# Patient Record
Sex: Male | Born: 1985 | Race: Black or African American | Hispanic: No | Marital: Single | Smoking: Never smoker
Health system: Southern US, Community
[De-identification: ages and names within clinical notes are randomized; demographics above are authoritative.]

---

## 2011-11-01 ENCOUNTER — Encounter (HOSPITAL_COMMUNITY): Payer: Self-pay | Admitting: Emergency Medicine

## 2011-11-01 ENCOUNTER — Emergency Department (HOSPITAL_COMMUNITY): Payer: Self-pay

## 2011-11-01 ENCOUNTER — Emergency Department (HOSPITAL_COMMUNITY)
Admission: EM | Admit: 2011-11-01 | Discharge: 2011-11-01 | Disposition: A | Discharge status: Home / Self Care | Payer: Self-pay | Attending: Emergency Medicine | Admitting: Emergency Medicine

## 2011-11-01 DIAGNOSIS — S93409A Sprain of unspecified ligament of unspecified ankle, initial encounter: Secondary | ICD-10-CM | POA: Insufficient documentation

## 2011-11-01 DIAGNOSIS — S93402A Sprain of unspecified ligament of left ankle, initial encounter: Secondary | ICD-10-CM

## 2011-11-01 DIAGNOSIS — Y9239 Other specified sports and athletic area as the place of occurrence of the external cause: Secondary | ICD-10-CM | POA: Insufficient documentation

## 2011-11-01 DIAGNOSIS — Y998 Other external cause status: Secondary | ICD-10-CM | POA: Insufficient documentation

## 2011-11-01 DIAGNOSIS — X500XXA Overexertion from strenuous movement or load, initial encounter: Secondary | ICD-10-CM | POA: Insufficient documentation

## 2011-11-01 DIAGNOSIS — Y9367 Activity, basketball: Secondary | ICD-10-CM | POA: Insufficient documentation

## 2011-11-01 MED ORDER — HYDROCODONE-ACETAMINOPHEN 5-325 MG PO TABS: 1.0000 | ORAL_TABLET | ORAL | Status: AC | PRN

## 2011-11-01 MED ORDER — HYDROCODONE-ACETAMINOPHEN 5-325 MG PO TABS
1.0000 | ORAL_TABLET | Freq: Once | ORAL | Status: AC
  Administered 2011-11-01: 1 via ORAL
  Filled 2011-11-01: qty 1

## 2011-11-01 NOTE — ED Notes (Signed)
Pt states he "rolled his ankle" on Thursday.  States swelling was going down, however pt went to work today, and ankle became more swollen.  2+ pulses, no edema. Able to walk on foot, with difficulty.

## 2011-11-01 NOTE — Progress Notes (Signed)
Orthopedic Tech Progress Note Patient Details:  Chris Hale 1985/05/30 [id number]  Ortho Devices Type of Ortho Device: ASO;Crutches Ortho Device/Splint Location: left ankle Ortho Device/Splint Interventions: Application   Chris Hale 11/01/2011, 10:07 PM

## 2011-11-01 NOTE — ED Notes (Addendum)
Rolled left ankle on Thursday and swelling not going down.

## 2011-11-01 NOTE — Discharge Instructions (Signed)
Ankle Sprain An ankle sprain is an injury to the strong, fibrous tissues (ligaments) that hold the bones of your ankle joint together.  CAUSES Ankle sprain usually is caused by a fall or by twisting your ankle. People who participate in sports are more prone to these types of injuries.  SYMPTOMS  Symptoms of ankle sprain include:  Pain in your ankle. The pain may be present at rest or only when you are trying to stand or walk.   Swelling.   Bruising. Bruising may develop immediately or within 1 to 2 days after your injury.   Difficulty standing or walking.  DIAGNOSIS  Your caregiver will ask you details about your injury and perform a physical exam of your ankle to determine if you have an ankle sprain. During the physical exam, your caregiver will press and squeeze specific areas of your foot and ankle. Your caregiver will try to move your ankle in certain ways. An X-ray exam may be done to be sure a bone was not broken or a ligament did not separate from one of the bones in your ankle (avulsion).  TREATMENT  Certain types of braces can help stabilize your ankle. Your caregiver can make a recommendation for this. Your caregiver may recommend the use of medication for pain. If your sprain is severe, your caregiver may refer you to a surgeon who helps to restore function to parts of your skeletal system (orthopedist) or a physical therapist. HOME CARE INSTRUCTIONS  Apply ice to your injury for 1 to 2 days or as directed by your caregiver. Applying ice helps to reduce inflammation and pain.  Put ice in a plastic bag.   Place a towel between your skin and the bag.   Leave the ice on for 15 to 20 minutes at a time, every 2 hours while you are awake.   Take over-the-counter or prescription medicines for pain, discomfort, or fever only as directed by your caregiver.   Keep your injured leg elevated, when possible, to lessen swelling.   If your caregiver recommends crutches, use them as  instructed. Gradually, put weight on the affected ankle. Continue to use crutches or a cane until you can walk without feeling pain in your ankle.   If you have a plaster splint, wear the splint as directed by your caregiver. Do not rest it on anything harder than a pillow the first 24 hours. Do not put weight on it. Do not get it wet. You may take it off to take a shower or bath.   You may have been given an elastic bandage to wear around your ankle to provide support. If the elastic bandage is too tight (you have numbness or tingling in your foot or your foot becomes cold and blue), adjust the bandage to make it comfortable.   If you have an air splint, you may blow more air into it or let air out to make it more comfortable. You may take your splint off at night and before taking a shower or bath.   Wiggle your toes in the splint several times per day if you are able.  SEEK MEDICAL CARE IF:   You have an increase in bruising, swelling, or pain.   Your toes feel cold.   Pain relief is not achieved with medication.  SEEK IMMEDIATE MEDICAL CARE IF: Your toes are numb or blue or you have severe pain. MAKE SURE YOU:   Understand these instructions.   Will watch your condition.     Will get help right away if you are not doing well or get worse.  Document Released: 05/03/2005 Document Revised: 04/22/2011 Document Reviewed: 12/06/2007 [facility] Patient Information 2012 [place], [place].Acute Ankle Sprain with Phase I Rehab An acute ankle sprain is a partial or complete tear in one or more of the ligaments of the ankle due to traumatic injury. The severity of the injury depends on both the the number of ligaments sprained and the grade of sprain. There are 3 grades of sprains.   A grade 1 sprain is a mild sprain. There is a slight pull without obvious tearing. There is no loss of strength, and the muscle and ligament are the correct length.   A grade 2 sprain is a moderate sprain. There is  tearing of fibers within the substance of the ligament where it connects two bones or two cartilages. The length of the ligament is increased, and there is usually decreased strength.   A grade 3 sprain is a complete rupture of the ligament and is uncommon.  In addition to the grade of sprain, there are three types of ankle sprains.  Lateral ankle sprains: This is a sprain of one or more of the three ligaments on the outer side (lateral) of the ankle. These are the most common sprains. Medial ankle sprains: There is one large triangular ligament of the inner side (medial) of the ankle that is susceptible to injury. Medial ankle sprains are less common. Syndesmosis, "high ankle," sprains: The syndesmosis is the ligament that connects the two bones of the lower leg. Syndesmosis sprains usually only occur with very severe ankle sprains. SYMPTOMS  Pain, tenderness, and swelling in the ankle, starting at the side of injury that may progress to the whole ankle and foot with time.   "Pop" or tearing sensation at the time of injury.   Bruising that may spread to the heel.   Impaired ability to walk soon after injury.  CAUSES   Acute ankle sprains are caused by trauma placed on the ankle that temporarily forces or pries the anklebone (talus) out of its normal socket.   Stretching or tearing of the ligaments that normally hold the joint in place (usually due to a twisting injury).  RISK INCREASES WITH:  Previous ankle sprain.   Sports in which the foot may land awkwardly (ie. basketball, volleyball, or soccer) or walking or running on uneven or rough surfaces.   Shoes with inadequate support to prevent sideways motion when stress occurs.   Poor strength and flexibility.   Poor balance skills.   Contact sports.  PREVENTION   Warm up and stretch properly before activity.   Maintain physical fitness:   Ankle and leg flexibility, muscle strength, and endurance.   Cardiovascular fitness.     Balance training activities.   Use proper technique and have a coach correct improper technique.   Taping, protective strapping, bracing, or high-top tennis shoes may help prevent injury. Initially, tape is best; however, it loses most of its support function within 10 to 15 minutes.   Wear proper fitted protective shoes (High-top shoes with taping or bracing is more effective than either alone).   Provide the ankle with support during sports and practice activities for 12 months following injury.  PROGNOSIS   If treated properly, ankle sprains can be expected to recover completely; however, the length of recovery depends on the degree of injury.   A grade 1 sprain usually heals enough in 5 to 7 days to allow  modified activity and requires an average of 6 weeks to heal completely.   A grade 2 sprain requires 6 to 10 weeks to heal completely.   A grade 3 sprain requires 12 to 16 weeks to heal.   A syndesmosis sprain often takes more than 3 months to heal.  RELATED COMPLICATIONS   Frequent recurrence of symptoms may result in a chronic problem. Appropriately addressing the problem the first time decreases the frequency of recurrence and optimizes healing time. Severity of the initial sprain does not predict the likelihood of later instability.   Injury to other structures (bone, cartilage, or tendon).   A chronically unstable or arthritic ankle joint is a possiblity with repeated sprains.  TREATMENT Treatment initially involves the use of ice, medication, and compression bandages to help reduce pain and inflammation. Ankle sprains are usually immobilized in a walking cast or boot to allow for healing. Crutches may be recommended to reduce pressure on the injury. After immobilization, strengthening and stretching exercises may be necessary to regain strength and a full range of motion. Surgery is rarely needed to treat ankle sprains. MEDICATION   Nonsteroidal anti-inflammatory  medications, such as aspirin and ibuprofen (do not take for the first 3 days after injury or within 7 days before surgery), or other minor pain relievers, such as acetaminophen, are often recommended. Take these as directed by your caregiver. Contact your caregiver immediately if any bleeding, stomach upset, or signs of an allergic reaction occur from these medications.   Ointments applied to the skin may be helpful.   Pain relievers may be prescribed as necessary by your caregiver. Do not take prescription pain medication for longer than 4 to 7 days. Use only as directed and only as much as you need.  HEAT AND COLD  Cold treatment (icing) is used to relieve pain and reduce inflammation for acute and chronic cases. Cold should be applied for 10 to 15 minutes every 2 to 3 hours for inflammation and pain and immediately after any activity that aggravates your symptoms. Use ice packs or an ice massage.   Heat treatment may be used before performing stretching and strengthening activities prescribed by your caregiver. Use a heat pack or a warm soak.  SEEK IMMEDIATE MEDICAL CARE IF:   Pain, swelling, or bruising worsens despite treatment.   You experience pain, numbness, discoloration, or coldness in the foot or toes.   New, unexplained symptoms develop (drugs used in treatment may produce side effects.)  EXERCISES  PHASE I EXERCISES RANGE OF MOTION (ROM) AND STRETCHING EXERCISES - Ankle Sprain, Acute Phase I, Weeks 1 to 2 These exercises may help you when beginning to restore flexibility in your ankle. You will likely work on these exercises for the 1 to 2 weeks after your injury. Once your physician, physical therapist, or athletic trainer sees adequate progress, he or she will advance your exercises. While completing these exercises, remember:   Restoring tissue flexibility helps normal motion to return to the joints. This allows healthier, less painful movement and activity.   An effective  stretch should be held for at least 30 seconds.   A stretch should never be painful. You should only feel a gentle lengthening or release in the stretched tissue.  RANGE OF MOTION - Dorsi/Plantar Flexion  While sitting with your right / left knee straight, draw the top of your foot upwards by flexing your ankle. Then reverse the motion, pointing your toes downward.   Hold each position for __________ seconds.  After completing your first set of exercises, repeat this exercise with your knee bent.  Repeat __________ times. Complete this exercise __________ times per day.  RANGE OF MOTION - Ankle Alphabet  Imagine your right / left big toe is a pen.   Keeping your hip and knee still, write out the entire alphabet with your "pen." Make the letters as large as you can without increasing any discomfort.  Repeat __________ times. Complete this exercise __________ times per day.  STRENGTHENING EXERCISES - Ankle Sprain, Acute -Phase I, Weeks 1 to 2 These exercises may help you when beginning to restore strength in your ankle. You will likely work on these exercises for 1 to 2 weeks after your injury. Once your physician, physical therapist, or athletic trainer sees adequate progress, he or she will advance your exercises. While completing these exercises, remember:   Muscles can gain both the endurance and the strength needed for everyday activities through controlled exercises.   Complete these exercises as instructed by your physician, physical therapist, or athletic trainer. Progress the resistance and repetitions only as guided.   You may experience muscle soreness or fatigue, but the pain or discomfort you are trying to eliminate should never worsen during these exercises. If this pain does worsen, stop and make certain you are following the directions exactly. If the pain is still present after adjustments, discontinue the exercise until you can discuss the trouble with your clinician.    STRENGTH - Dorsiflexors  Secure a rubber exercise band/tubing to a fixed object (ie. table, pole) and loop the other end around your right / left foot.   Sit on the floor facing the fixed object. The band/tubing should be slightly tense when your foot is relaxed.   Slowly draw your foot back toward you using your ankle and toes.   Hold this position for __________ seconds. Slowly release the tension in the band and return your foot to the starting position.  Repeat __________ times. Complete this exercise __________ times per day.  STRENGTH - Plantar-flexors   Sit with your right / left leg extended. Holding onto both ends of a rubber exercise band/tubing, loop it around the ball of your foot. Keep a slight tension in the band.   Slowly push your toes away from you, pointing them downward.   Hold this position for __________ seconds. Return slowly, controlling the tension in the band/tubing.  Repeat __________ times. Complete this exercise __________ times per day.  STRENGTH - Ankle Eversion  Secure one end of a rubber exercise band/tubing to a fixed object (table, pole). Loop the other end around your foot just before your toes.   Place your fists between your knees. This will focus your strengthening at your ankle.   Drawing the band/tubing across your opposite foot, slowly, pull your little toe out and up. Make sure the band/tubing is positioned to resist the entire motion.   Hold this position for __________ seconds.  Have your muscles resist the band/tubing as it slowly pulls your foot back to the starting position.  Repeat __________ times. Complete this exercise __________ times per day.  STRENGTH - Ankle Inversion  Secure one end of a rubber exercise band/tubing to a fixed object (table, pole). Loop the other end around your foot just before your toes.   Place your fists between your knees. This will focus your strengthening at your ankle.   Slowly, pull your big toe up  and in, making sure the band/tubing is positioned to  resist the entire motion.   Hold this position for __________ seconds.   Have your muscles resist the band/tubing as it slowly pulls your foot back to the starting position.  Repeat __________ times. Complete this exercises __________ times per day.  STRENGTH - Towel Curls  Sit in a chair positioned on a non-carpeted surface.   Place your right / left foot on a towel, keeping your heel on the floor.   Pull the towel toward your heel by only curling your toes. Keep your heel on the floor.   If instructed by your physician, physical therapist, or athletic trainer, add weight to the end of the towel.  Repeat __________ times. Complete this exercise __________ times per day. Document Released: 12/02/2004 Document Revised: 04/22/2011 Document Reviewed: 08/15/2008 [facility] Patient Information 2012 [place], [place].

## 2011-11-01 NOTE — ED Provider Notes (Signed)
History     CSN: [id number]  Arrival date & time 11/01/11  2012   First MD Initiated Contact with Patient 11/01/11 2127      No chief complaint on file.   (Consider location/radiation/quality/duration/timing/severity/associated sxs/prior treatment) HPI Comments: Patient states that he was playing basketball on Saturday when he twisted his left ankle - states has been able to ambulate on the ankle but that he went back to work today and the swelling has gotten worse - denies fever, chills, nausea, vomiting, numbness or tingling.  Patient is a 26 y.o. male presenting with ankle pain. The history is provided by the patient. No language interpreter was used.  Ankle Pain  The incident occurred more than 2 days ago. The incident occurred at the gym. The injury mechanism was torsion. The pain is present in the left ankle. The quality of the pain is described as aching. The pain is at a severity of 5/10. The pain is moderate. The pain has been constant since onset. Pertinent negatives include no numbness, no inability to bear weight, no loss of motion, no muscle weakness, no loss of sensation and no tingling. He reports no foreign bodies present. The symptoms are aggravated by bearing weight. He has tried NSAIDs for the symptoms. The treatment provided no relief.    No past medical history on file.  No past surgical history on file.  No family history on file.  History  Substance Use Topics  . Smoking status: Not on file  . Smokeless tobacco: Not on file  . Alcohol Use: Not on file      Review of Systems  Constitutional: Negative for fever and chills.  HENT: Negative for neck pain.   Eyes: Negative for pain.  Respiratory: Negative for chest tightness and shortness of breath.   Cardiovascular: Negative for chest pain.  Gastrointestinal: Negative for nausea, vomiting and abdominal pain.  Genitourinary: Negative for dysuria.  Musculoskeletal: Positive for joint swelling and  arthralgias. Negative for back pain and gait problem.  Skin: Negative for wound.  Neurological: Negative for tingling, numbness and headaches.  All other systems reviewed and are negative.    Allergies  Review of patient's allergies indicates no known allergies.  Home Medications   Current Outpatient Rx  Name Route Sig Dispense Refill  . IBUPROFEN 200 MG PO TABS Oral Take 800 mg by mouth every 6 (six) hours as needed. For pain      BP 130/75  Temp 97.8 F (36.6 C) (Oral)  Resp 18  SpO2 98%  Physical Exam  Nursing note and vitals reviewed. Constitutional: He is oriented to person, place, and time. He appears well-developed and well-nourished. No distress.  HENT:  Head: Normocephalic and atraumatic.  Right Ear: External ear normal.  Left Ear: External ear normal.  Nose: Nose normal.  Mouth/Throat: Oropharynx is clear and moist. No oropharyngeal exudate.  Eyes: Conjunctivae are normal. Pupils are equal, round, and reactive to light. No scleral icterus.  Neck: Normal range of motion. Neck supple.  Cardiovascular: Normal rate, regular rhythm and normal heart sounds.  Exam reveals no gallop and no friction rub.   No murmur heard. Pulmonary/Chest: Effort normal and breath sounds normal. No respiratory distress. He has no wheezes. He has no rales. He exhibits no tenderness.  Abdominal: Soft. Bowel sounds are normal. He exhibits no distension. There is no tenderness.  Musculoskeletal:       Left ankle: He exhibits decreased range of motion and swelling. He exhibits no deformity and  normal pulse. tenderness. Lateral malleolus tenderness found. No medial malleolus tenderness found.  Lymphadenopathy:    He has no cervical adenopathy.  Neurological: He is alert and oriented to person, place, and time. No cranial nerve deficit.  Skin: Skin is warm and dry. No rash noted. No erythema. No pallor.  Psychiatric: He has a normal mood and affect. His behavior is normal. Judgment and thought  content normal.    ED Course  Procedures (including critical care time)  Labs Reviewed - No data to display Dg Ankle Complete Left  11/01/2011  *RADIOLOGY REPORT*  Clinical Data: Fall, pain.  LEFT ANKLE COMPLETE - 3+ VIEW  Comparison: None.  Findings: Lateral soft tissue swelling.  No fracture, subluxation or dislocation.  Degenerative changes within the left ankle.  IMPRESSION: No acute bony abnormality.  Original Report Authenticated By: Cyndie Chime, M.D.     Left ankle sprain   MDM  Patient here with left ankle sprain without fracture - will be given ASO and crutches - instructed to stay off the ankle for the next week.        Izola Price [place], [place] 11/01/11 2158

## 2011-11-01 NOTE — ED Notes (Signed)
Rx x 1, pt voiced understanding to f/u with ortho in 1 week if sx do not improve

## 2011-11-02 NOTE — ED Provider Notes (Signed)
Medical screening examination/treatment/procedure(s) were performed by non-physician practitioner and as supervising physician I was immediately available for consultation/collaboration.  Jumar Greenstreet L Bena Kobel, MD 11/02/11 2020 

## 2013-03-13 ENCOUNTER — Ambulatory Visit: Payer: Self-pay

## 2013-03-16 ENCOUNTER — Emergency Department (HOSPITAL_COMMUNITY)
Admission: EM | Admit: 2013-03-16 | Discharge: 2013-03-16 | Disposition: A | Discharge status: Home / Self Care | Payer: Self-pay | Attending: Emergency Medicine | Admitting: Emergency Medicine

## 2013-03-16 ENCOUNTER — Encounter (HOSPITAL_COMMUNITY): Payer: Self-pay | Admitting: Emergency Medicine

## 2013-03-16 DIAGNOSIS — J029 Acute pharyngitis, unspecified: Secondary | ICD-10-CM | POA: Insufficient documentation

## 2013-03-16 LAB — RAPID STREP SCREEN (MED CTR MEBANE ONLY): Streptococcus, Group A Screen (Direct): NEGATIVE

## 2013-03-16 MED ORDER — ACETAMINOPHEN 325 MG PO TABS
650.0000 mg | ORAL_TABLET | Freq: Once | ORAL | Status: AC
  Administered 2013-03-16: 650 mg via ORAL
  Filled 2013-03-16: qty 2

## 2013-03-16 MED ORDER — ACETAMINOPHEN 325 MG PO TABS: 650.0000 mg | ORAL_TABLET | Freq: Four times a day (QID) | ORAL | Status: DC | PRN

## 2013-03-16 MED ORDER — IBUPROFEN 800 MG PO TABS: 800.0000 mg | ORAL_TABLET | Freq: Four times a day (QID) | ORAL | Status: DC | PRN

## 2013-03-16 MED ORDER — HYDROCODONE-ACETAMINOPHEN 7.5-325 MG/15ML PO SOLN
10.0000 mL | Freq: Once | ORAL | Status: AC
  Administered 2013-03-16: 10 mL via ORAL
  Filled 2013-03-16: qty 15

## 2013-03-16 MED ORDER — HYDROCODONE-ACETAMINOPHEN 7.5-325 MG/15ML PO SOLN: 10.0000 mL | Freq: Four times a day (QID) | ORAL | Status: DC | PRN

## 2013-03-16 NOTE — ED Provider Notes (Signed)
CSN: [id number]     Arrival date & time 03/16/13  0117 History   First MD Initiated Contact with Patient 03/16/13 0140     Chief Complaint  Patient presents with  . Sore Throat   (Consider location/radiation/quality/duration/timing/severity/associated sxs/prior Treatment) HPI Comments: Patient is a 27 yo M presenting for two days of a moderate sore throat w/ associated myalgias. Patient has been taking Tylenol cough and cold medicine with minimal to no relief from his symptoms. He states eating worsens his pain. Denies any other aggravating factors. No known sick contacts. Denies cough, nasal congestion, rhinorrhea, ear pain, headache, documented fever, vomiting.   Patient is a 27 y.o. male presenting with pharyngitis.  Sore Throat Associated symptoms include a sore throat. Pertinent negatives include no abdominal pain, congestion, coughing, fever or headaches.    History reviewed. No pertinent past medical history. History reviewed. No pertinent past surgical history. History reviewed. No pertinent family history. History  Substance Use Topics  . Smoking status: Never Smoker   . Smokeless tobacco: Not on file  . Alcohol Use: No    Review of Systems  Constitutional: Negative for fever.  HENT: Positive for sore throat. Negative for congestion, drooling, ear pain, facial swelling, rhinorrhea, sinus pressure, trouble swallowing and voice change.   Respiratory: Negative for cough.   Gastrointestinal: Negative for abdominal pain.  Neurological: Negative for headaches.    Allergies  Review of patient's allergies indicates no known allergies.  Home Medications   Current Outpatient Rx  Name  Route  Sig  Dispense  Refill  . acetaminophen (TYLENOL) 325 MG tablet   Oral   Take 2 tablets (650 mg total) by mouth every 6 (six) hours as needed for pain or fever.   30 tablet   0   . HYDROcodone-acetaminophen (HYCET) 7.5-325 mg/15 ml solution   Oral   Take 10 mLs by mouth every 6  (six) hours as needed for pain or cough.   120 mL   0   . ibuprofen (ADVIL,MOTRIN) 200 MG tablet   Oral   Take 800 mg by mouth every 6 (six) hours as needed. For pain         . ibuprofen (ADVIL,MOTRIN) 800 MG tablet   Oral   Take 1 tablet (800 mg total) by mouth every 6 (six) hours as needed for pain or fever.   21 tablet   0    BP 130/86  Pulse 78  Temp(Src) 100 F (37.8 C) (Oral)  Resp 20  SpO2 100% Physical Exam  Constitutional: He is oriented to person, place, and time. He appears well-developed and well-nourished. No distress.  HENT:  Head: Normocephalic and atraumatic.  Right Ear: External ear normal.  Left Ear: External ear normal.  Nose: Nose normal.  Mouth/Throat: Uvula is midline. Mucous membranes are dry. No oral lesions. No trismus in the jaw. No dental abscesses or uvula swelling. Oropharyngeal exudate and posterior oropharyngeal erythema present. No posterior oropharyngeal edema.  Eyes: Conjunctivae are normal.  Neck: Normal range of motion. Neck supple. No tracheal deviation present.  Cardiovascular: Normal rate.   Pulmonary/Chest: Effort normal.  Abdominal: Soft.  Musculoskeletal: Normal range of motion.  Lymphadenopathy:    He has cervical adenopathy.  Neurological: He is alert and oriented to person, place, and time.  Skin: Skin is warm and dry. He is not diaphoretic.  Psychiatric: He has a normal mood and affect.    ED Course  Procedures (including critical care time) Medications  acetaminophen (TYLENOL) tablet  650 mg (650 mg Oral Given 03/16/13 0226)  HYDROcodone-acetaminophen (HYCET) 7.5-325 mg/15 ml solution 10 mL (10 mLs Oral Given 03/16/13 0247)    Labs Review Labs Reviewed  RAPID STREP SCREEN  CULTURE, GROUP A STREP   Imaging Review No results found.  EKG Interpretation   None       MDM   1. Viral pharyngitis    Low grade fever, NAD, non-toxic appearing, AAOx4. Pt w/ low grade fever without tonsillar exudate, negative  strep. Presents with mild cervical lymphadenopathy, & dysphagia; diagnosis of viral pharyngitis. No abx indicated. DC w symptomatic tx for pain  Pt does not appear dehydrated, but did discuss importance of water rehydration. Presentation non concerning for PTA or infxn spread to soft tissue. No trismus or uvula deviation. Specific return precautions discussed. Pt able to drink water in ED without difficulty with intact air way. Recommended PCP follow up. Patient is agreeable to plan. Patient d/w with Dr. Norlene Campbell, agrees with plan. Patient is stable at time of discharge         Jeannetta Ellis, PA-C 03/16/13 7253

## 2013-03-16 NOTE — ED Notes (Signed)
Sore throat for two days. Body hurting all over.

## 2013-03-16 NOTE — ED Notes (Signed)
Pt has ride home.

## 2013-03-16 NOTE — ED Provider Notes (Signed)
Medical screening examination/treatment/procedure(s) were performed by non-physician practitioner and as supervising physician I was immediately available for consultation/collaboration.    Tanza Pellot M Yaqueline Gutter, MD 03/16/13 0702 

## 2013-03-18 LAB — CULTURE, GROUP A STREP

## 2013-03-20 ENCOUNTER — Emergency Department (HOSPITAL_COMMUNITY)
Admission: EM | Admit: 2013-03-20 | Discharge: 2013-03-20 | Disposition: A | Discharge status: Home / Self Care | Payer: Self-pay | Attending: Emergency Medicine | Admitting: Emergency Medicine

## 2013-03-20 ENCOUNTER — Encounter (HOSPITAL_COMMUNITY): Payer: Self-pay | Admitting: Emergency Medicine

## 2013-03-20 DIAGNOSIS — J029 Acute pharyngitis, unspecified: Secondary | ICD-10-CM | POA: Insufficient documentation

## 2013-03-20 DIAGNOSIS — R509 Fever, unspecified: Secondary | ICD-10-CM | POA: Insufficient documentation

## 2013-03-20 DIAGNOSIS — IMO0001 Reserved for inherently not codable concepts without codable children: Secondary | ICD-10-CM | POA: Insufficient documentation

## 2013-03-20 DIAGNOSIS — R1310 Dysphagia, unspecified: Secondary | ICD-10-CM | POA: Insufficient documentation

## 2013-03-20 DIAGNOSIS — R599 Enlarged lymph nodes, unspecified: Secondary | ICD-10-CM | POA: Insufficient documentation

## 2013-03-20 LAB — CBC WITH DIFFERENTIAL/PLATELET
Basophils Absolute: 0 10*3/uL (ref 0.0–0.1)
Basophils Relative: 0 % (ref 0–1)
Hemoglobin: 14.8 g/dL (ref 13.0–17.0)
MCHC: 35.2 g/dL (ref 30.0–36.0)
Neutro Abs: 8.7 10*3/uL — ABNORMAL HIGH (ref 1.7–7.7)
Neutrophils Relative %: 74 % (ref 43–77)
RDW: 13.7 % (ref 11.5–15.5)

## 2013-03-20 MED ORDER — SODIUM CHLORIDE 0.9 % IV BOLUS (SEPSIS)
1000.0000 mL | Freq: Once | INTRAVENOUS | Status: AC
  Administered 2013-03-20: 1000 mL via INTRAVENOUS

## 2013-03-20 MED ORDER — DEXAMETHASONE SODIUM PHOSPHATE 10 MG/ML IJ SOLN
10.0000 mg | Freq: Once | INTRAMUSCULAR | Status: AC
  Administered 2013-03-20: 10 mg via INTRAVENOUS
  Filled 2013-03-20: qty 1

## 2013-03-20 MED ORDER — KETOROLAC TROMETHAMINE 30 MG/ML IJ SOLN
30.0000 mg | Freq: Once | INTRAMUSCULAR | Status: AC
  Administered 2013-03-20: 30 mg via INTRAVENOUS
  Filled 2013-03-20: qty 1

## 2013-03-20 MED ORDER — ACETAMINOPHEN-CODEINE 120-12 MG/5ML PO SOLN: 10.0000 mL | ORAL | Status: DC | PRN

## 2013-03-20 NOTE — ED Provider Notes (Signed)
CSN: [id number]     Arrival date & time 03/20/13  4098 History   First MD Initiated Contact with Patient 03/20/13 0525     Chief Complaint  Patient presents with  . Sore Throat   (Consider location/radiation/quality/duration/timing/severity/associated sxs/prior Treatment) HPI Comments: Patient has had a sore throat, and myalgias.  For 7-10 days.  Was seen October 31 and, had a negative strep screen and culture.  Since that time.  He has had increased pain, low-grade fever, difficulty swallowing.  It is not controlled by oral narcotics or liquid ibuprofen.  Patient is a 27 y.o. male presenting with pharyngitis. The history is provided by the patient.  Sore Throat This is a recurrent problem. The current episode started in the past 7 days. The problem occurs constantly. The problem has been gradually worsening. Associated symptoms include a sore throat and swollen glands. Pertinent negatives include no coughing, fever, headaches, neck pain or rash. The symptoms are aggravated by swallowing. He has tried acetaminophen, drinking, NSAIDs and oral narcotics for the symptoms. The treatment provided no relief.    No past medical history on file. No past surgical history on file. No family history on file. History  Substance Use Topics  . Smoking status: Never Smoker   . Smokeless tobacco: Not on file  . Alcohol Use: No    Review of Systems  Constitutional: Negative for fever.  HENT: Positive for sore throat, trouble swallowing and voice change.   Respiratory: Negative for cough.   Musculoskeletal: Negative for neck pain and neck stiffness.  Skin: Negative for rash.  Neurological: Negative for dizziness and headaches.  All other systems reviewed and are negative.    Allergies  Review of patient's allergies indicates no known allergies.  Home Medications   Current Outpatient Rx  Name  Route  Sig  Dispense  Refill  . acetaminophen (TYLENOL) 325 MG tablet   Oral   Take 2 tablets (650  mg total) by mouth every 6 (six) hours as needed for pain or fever.   30 tablet   0   . HYDROcodone-acetaminophen (HYCET) 7.5-325 mg/15 ml solution   Oral   Take 10 mLs by mouth every 6 (six) hours as needed for pain or cough.   120 mL   0   . ibuprofen (ADVIL,MOTRIN) 200 MG tablet   Oral   Take 800 mg by mouth every 6 (six) hours as needed. For pain         . ibuprofen (ADVIL,MOTRIN) 800 MG tablet   Oral   Take 1 tablet (800 mg total) by mouth every 6 (six) hours as needed for pain or fever.   21 tablet   0    BP 139/94  Pulse 77  Temp(Src) 99.4 F (37.4 C) (Oral)  Resp 16  Wt 220 lb (99.791 kg)  SpO2 100% Physical Exam  Nursing note and vitals reviewed. Constitutional: He appears well-developed and well-nourished. No distress.  HENT:  Head: Normocephalic.  Right Ear: External ear normal.  Left Ear: External ear normal.  Mouth/Throat: Uvula is midline. Uvula swelling present. Posterior oropharyngeal edema and posterior oropharyngeal erythema present. No oropharyngeal exudate.  Eyes: Pupils are equal, round, and reactive to light.  Neck: Normal range of motion.  Cardiovascular: Normal rate and regular rhythm.   Pulmonary/Chest: Effort normal and breath sounds normal.  Abdominal: Soft. Bowel sounds are normal.  Lymphadenopathy:    He has cervical adenopathy.  Neurological: He is alert.  Skin: Skin is warm. No rash noted.  No pallor.    ED Course  Procedures (including critical care time) Labs Review Labs Reviewed  CBC WITH DIFFERENTIAL  MONONUCLEOSIS SCREEN   Imaging Review No results found.  EKG Interpretation   None       MDM  No diagnosis found.  Will obtain CBC, Monospot have asked that the patient had an IV started with 1 L of fluid.  10 mg of Decadron, and 30 mg of ibuprofen, IV.  For pain, and swelling    Arman Filter, NP 03/20/13 0537  Arman Filter, NP 03/20/13 [phone]

## 2013-03-20 NOTE — ED Notes (Signed)
Sore throat x one week.  Was seen here on 10/31 for same.  Reports medications given are not helping.

## 2013-03-20 NOTE — ED Notes (Signed)
Pt reports he is feeling much better.  Awaiting test results; anxious to eat.

## 2013-03-20 NOTE — ED Notes (Signed)
Presents with bilateral swelling to throat, tonsils enlarged.  Is experiencing difficulty swallowing, spitting into basin in room.  Able to speak, voice slightly muffled as he is unable to use tongue to speak due to pain.  Alert and appropriate, skin warm and dry.

## 2013-03-20 NOTE — ED Provider Notes (Signed)
Medical screening examination/treatment/procedure(s) were performed by non-physician practitioner and as supervising physician I was immediately available for consultation/collaboration.    Fateh Kindle M Chaysen Tillman, MD 03/20/13 0621 

## 2013-03-21 ENCOUNTER — Emergency Department (HOSPITAL_COMMUNITY): Payer: Self-pay

## 2013-03-21 ENCOUNTER — Encounter (HOSPITAL_COMMUNITY): Payer: Self-pay | Admitting: Emergency Medicine

## 2013-03-21 ENCOUNTER — Emergency Department (HOSPITAL_COMMUNITY)
Admission: EM | Admit: 2013-03-21 | Discharge: 2013-03-21 | Disposition: A | Discharge status: Home / Self Care | Payer: Self-pay | Attending: Emergency Medicine | Admitting: Emergency Medicine

## 2013-03-21 DIAGNOSIS — J36 Peritonsillar abscess: Secondary | ICD-10-CM | POA: Insufficient documentation

## 2013-03-21 DIAGNOSIS — R599 Enlarged lymph nodes, unspecified: Secondary | ICD-10-CM | POA: Insufficient documentation

## 2013-03-21 LAB — CBC WITH DIFFERENTIAL/PLATELET
Eosinophils Relative: 1 % (ref 0–5)
Lymphocytes Relative: 19 % (ref 12–46)
Monocytes Absolute: 1.3 10*3/uL — ABNORMAL HIGH (ref 0.1–1.0)
Monocytes Relative: 9 % (ref 3–12)
Neutrophils Relative %: 71 % (ref 43–77)
Platelets: 170 10*3/uL (ref 150–400)
RBC: 4.54 MIL/uL (ref 4.22–5.81)
WBC: 13.9 10*3/uL — ABNORMAL HIGH (ref 4.0–10.5)

## 2013-03-21 LAB — BASIC METABOLIC PANEL
CO2: 26 mEq/L (ref 19–32)
Calcium: 8.9 mg/dL (ref 8.4–10.5)
Chloride: 104 mEq/L (ref 96–112)
Sodium: 139 mEq/L (ref 135–145)

## 2013-03-21 MED ORDER — IOHEXOL 300 MG/ML  SOLN
80.0000 mL | Freq: Once | INTRAMUSCULAR | Status: AC | PRN
  Administered 2013-03-21: 80 mL via INTRAVENOUS

## 2013-03-21 MED ORDER — SODIUM CHLORIDE 0.9 % IV BOLUS (SEPSIS)
1000.0000 mL | Freq: Once | INTRAVENOUS | Status: AC
  Administered 2013-03-21: 1000 mL via INTRAVENOUS

## 2013-03-21 MED ORDER — HYDROMORPHONE HCL PF 1 MG/ML IJ SOLN
1.0000 mg | Freq: Once | INTRAMUSCULAR | Status: AC
  Administered 2013-03-21: 1 mg via INTRAVENOUS
  Filled 2013-03-21: qty 1

## 2013-03-21 MED ORDER — SODIUM CHLORIDE 0.9 % IV SOLN
INTRAVENOUS | Status: DC
  Administered 2013-03-21: 10:00:00 via INTRAVENOUS

## 2013-03-21 MED ORDER — CLINDAMYCIN PHOSPHATE 600 MG/50ML IV SOLN
600.0000 mg | Freq: Once | INTRAVENOUS | Status: AC
  Administered 2013-03-21: 600 mg via INTRAVENOUS
  Filled 2013-03-21: qty 50

## 2013-03-21 MED ORDER — ONDANSETRON HCL 4 MG/2ML IJ SOLN
4.0000 mg | Freq: Once | INTRAMUSCULAR | Status: AC
  Administered 2013-03-21: 4 mg via INTRAVENOUS
  Filled 2013-03-21: qty 2

## 2013-03-21 MED ORDER — CLINDAMYCIN PHOSPHATE 900 MG/50ML IV SOLN: 900.0000 mg | Freq: Once | INTRAVENOUS | Status: DC

## 2013-03-21 NOTE — ED Notes (Addendum)
Pt states that left side of face and head hurts. Pt states that he has increased difficulty with swallowing. Pt noted to have red swollen tonsil on left side. Pt reports ear pain on left side as well.
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
## 2013-03-21 NOTE — ED Notes (Signed)
Pt states that he has had a sore throat since last week. Pt was seen yesterday for same. States that pain has continued. Pt did fill prescription for tylenol with codeine but states that it is not working. Pt has not called for followup appt

## 2013-03-21 NOTE — ED Provider Notes (Signed)
Medical screening examination/treatment/procedure(s) were performed by non-physician practitioner and as supervising physician I was immediately available for consultation/collaboration.    Olivia Mackie, MD 03/21/13 [phone]

## 2013-03-21 NOTE — ED Notes (Signed)
Pt in CT.

## 2013-03-21 NOTE — ED Provider Notes (Signed)
  Physical Exam  BP 125/84  Pulse 69  Temp(Src) 97.8 F (36.6 C) (Oral)  Resp 16  SpO2 98%  Physical Exam  ED Course  Procedures  Patient is referred to ENT.  Patient's mono was negative.  The patient is maintaining his airway without difficulty    Carlyle Dolly, PA-C 03/21/13 [phone]

## 2013-03-21 NOTE — ED Notes (Signed)
Pt monitored per IV narcotic use. Pt drowsy but easily arousable- no adverse SE. Family member at bedside to pick up pt and take to ENT. Pt wheeled to discharge.
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
## 2014-02-20 ENCOUNTER — Emergency Department (HOSPITAL_COMMUNITY)
Admission: EM | Admit: 2014-02-20 | Discharge: 2014-02-20 | Disposition: A | Discharge status: Home / Self Care | Payer: Self-pay | Attending: Emergency Medicine | Admitting: Emergency Medicine

## 2014-02-20 ENCOUNTER — Encounter (HOSPITAL_COMMUNITY): Payer: Self-pay | Admitting: Emergency Medicine

## 2014-02-20 DIAGNOSIS — R1013 Epigastric pain: Secondary | ICD-10-CM | POA: Insufficient documentation

## 2014-02-20 DIAGNOSIS — R197 Diarrhea, unspecified: Secondary | ICD-10-CM | POA: Insufficient documentation

## 2014-02-20 DIAGNOSIS — J019 Acute sinusitis, unspecified: Secondary | ICD-10-CM | POA: Insufficient documentation

## 2014-02-20 DIAGNOSIS — I369 Nonrheumatic tricuspid valve disorder, unspecified: Secondary | ICD-10-CM

## 2014-02-20 DIAGNOSIS — R9431 Abnormal electrocardiogram [ECG] [EKG]: Secondary | ICD-10-CM | POA: Insufficient documentation

## 2014-02-20 LAB — CBC WITH DIFFERENTIAL/PLATELET
BASOS ABS: 0 10*3/uL (ref 0.0–0.1)
BASOS PCT: 0 % (ref 0–1)
EOS ABS: 0.1 10*3/uL (ref 0.0–0.7)
EOS PCT: 1 % (ref 0–5)
HCT: 42.9 % (ref 39.0–52.0)
Hemoglobin: 14.6 g/dL (ref 13.0–17.0)
Lymphocytes Relative: 26 % (ref 12–46)
Lymphs Abs: 1.8 10*3/uL (ref 0.7–4.0)
MCH: 28.4 pg (ref 26.0–34.0)
MCHC: 34 g/dL (ref 30.0–36.0)
MCV: 83.5 fL (ref 78.0–100.0)
MONO ABS: 0.6 10*3/uL (ref 0.1–1.0)
Monocytes Relative: 8 % (ref 3–12)
NEUTROS ABS: 4.5 10*3/uL (ref 1.7–7.7)
Neutrophils Relative %: 65 % (ref 43–77)
Platelets: 144 10*3/uL — ABNORMAL LOW (ref 150–400)
RBC: 5.14 MIL/uL (ref 4.22–5.81)
RDW: 13.4 % (ref 11.5–15.5)
WBC: 6.9 10*3/uL (ref 4.0–10.5)

## 2014-02-20 LAB — COMPREHENSIVE METABOLIC PANEL
ALBUMIN: 4 g/dL (ref 3.5–5.2)
ALT: 27 U/L (ref 0–53)
ANION GAP: 11 (ref 5–15)
AST: 23 U/L (ref 0–37)
Alkaline Phosphatase: 71 U/L (ref 39–117)
BILIRUBIN TOTAL: 0.4 mg/dL (ref 0.3–1.2)
BUN: 20 mg/dL (ref 6–23)
CALCIUM: 8.9 mg/dL (ref 8.4–10.5)
CHLORIDE: 104 meq/L (ref 96–112)
CO2: 25 mEq/L (ref 19–32)
CREATININE: 1.03 mg/dL (ref 0.50–1.35)
GFR calc Af Amer: >90 mL/min (ref >90)
GFR calc non Af Amer: >90 mL/min (ref >90)
Glucose, Bld: 102 mg/dL — ABNORMAL HIGH (ref 70–99)
Potassium: 4 mEq/L (ref 3.7–5.3)
Sodium: 140 mEq/L (ref 137–147)
Total Protein: 7.6 g/dL (ref 6.0–8.3)

## 2014-02-20 LAB — I-STAT TROPONIN, ED: Troponin i, poc: 0 ng/mL (ref 0.00–0.08)

## 2014-02-20 MED ORDER — HYDROCODONE-ACETAMINOPHEN 5-325 MG PO TABS
1.0000 | ORAL_TABLET | Freq: Once | ORAL | Status: AC
  Administered 2014-02-20: 1 via ORAL
  Filled 2014-02-20: qty 1

## 2014-02-20 MED ORDER — PSEUDOEPHEDRINE HCL 30 MG PO TABS: 30.0000 mg | ORAL_TABLET | Freq: Four times a day (QID) | ORAL | Status: DC | PRN

## 2014-02-20 MED ORDER — OXYMETAZOLINE HCL 0.05 % NA SOLN: 1.0000 | Freq: Two times a day (BID) | NASAL | Status: DC

## 2014-02-20 NOTE — ED Provider Notes (Signed)
CSN: [id number]     Arrival date & time 02/20/14  4098 History   First MD Initiated Contact with Patient 02/20/14 0740     Chief Complaint  Patient presents with  . Abdominal Pain  . URI     (Consider location/radiation/quality/duration/timing/severity/associated sxs/prior Treatment) HPI Comments: Patient presents with complaint of sinus congestion and pressure with sore throat x3 days. He had subjective fever but no documented fever. Family members at home had also been sick with similar symptoms. No nausea, vomiting. Patient has had several episodes of diarrhea. No chest pain or shortness of breath. Patient has taken over-the-counter vitamin C, TheraFlu, and Sudafed for symptoms.  Patient also has had intermittent episodes of epigastric pain during this time. Patient had a more significant episode early this morning which was relieved with a bowel movement. Patient now has very mild epigastric pain. Pain is described as sharp and nonradiating.  No prior cardiac history. No history of hypertension, high cholesterol, diabetes, smoking. No family history of early CAD. Patient denies alcohol or drug use including cocaine.     The history is provided by the patient and medical records.    History reviewed. No pertinent past medical history. History reviewed. No pertinent past surgical history. History reviewed. No pertinent family history. History  Substance Use Topics  . Smoking status: Never Smoker   . Smokeless tobacco: Not on file  . Alcohol Use: No    Review of Systems  Constitutional: Positive for fever (subjective).  HENT: Positive for rhinorrhea, sinus pressure and sore throat.   Eyes: Negative for redness.  Respiratory: Negative for cough and shortness of breath.   Cardiovascular: Negative for chest pain.  Gastrointestinal: Positive for abdominal pain (epigastric) and diarrhea. Negative for nausea and vomiting.  Genitourinary: Negative for dysuria.  Musculoskeletal:  Negative for myalgias.  Skin: Negative for rash.  Neurological: Negative for headaches.    Allergies  Review of patient's allergies indicates no known allergies.  Home Medications   Prior to Admission medications   Medication Sig Start Date End Date Taking? Authorizing Provider  Ascorbic Acid (VITAMIN C PO) Take 1 tablet by mouth every 4 (four) hours as needed (immune health).    Yes Historical Provider, MD  Chlorphen-Pseudoephed-APAP (THERAFLU FLU/COLD PO) Take 1 packet by mouth 2 (two) times daily as needed (pain/cold symptoms).   Yes Historical Provider, MD  Phenyleph-CPM-DM-APAP (TYLENOL COLD HEAD CONGESTION PO) Take 2 tablets by mouth every 4 (four) hours as needed (pain/cold symptoms).   Yes Historical Provider, MD   BP 122/75  Pulse 66  Temp(Src) 97.6 F (36.4 C)  Resp 18  SpO2 99%  Physical Exam  Nursing note and vitals reviewed. Constitutional: He appears well-developed and well-nourished.  HENT:  Head: Normocephalic and atraumatic.  Right Ear: Tympanic membrane, external ear and ear canal normal.  Left Ear: Tympanic membrane, external ear and ear canal normal.  Nose: Mucosal edema and rhinorrhea present.  Mouth/Throat: Uvula is midline, oropharynx is clear and moist and mucous membranes are normal. Mucous membranes are not dry. No oropharyngeal exudate, posterior oropharyngeal edema or posterior oropharyngeal erythema.  Eyes: Conjunctivae are normal. Right eye exhibits no discharge. Left eye exhibits no discharge.  Neck: Trachea normal and normal range of motion. Neck supple. Normal carotid pulses and no JVD present. No muscular tenderness present. Carotid bruit is not present. No tracheal deviation present.  Cardiovascular: Normal rate, regular rhythm, S1 normal, S2 normal, normal heart sounds and intact distal pulses.  Exam reveals no distant heart  sounds and no decreased pulses.   No murmur heard. No murmur.   Pulmonary/Chest: Effort normal and breath sounds normal.  No respiratory distress. He has no wheezes. He has no rales. He exhibits no tenderness.  Abdominal: Soft. Normal aorta and bowel sounds are normal. There is no tenderness. There is no rebound and no guarding.  Musculoskeletal: He exhibits no edema.  Neurological: He is alert.  Skin: Skin is warm and dry. He is not diaphoretic. No cyanosis. No pallor.  Psychiatric: He has a normal mood and affect.    ED Course  Procedures (including critical care time) Labs Review Labs Reviewed  CBC WITH DIFFERENTIAL - Abnormal; Notable for the following:    Platelets 144 (*)    All other components within normal limits  COMPREHENSIVE METABOLIC PANEL - Abnormal; Notable for the following:    Glucose, Bld 102 (*)    All other components within normal limits  I-STAT TROPOININ, ED    Imaging Review No results found.   EKG Interpretation None      Patient seen and examined. Patient's exam is unremarkable. EKG ordered given epigastric pain and I noted inverted t-wave in II on monitor.    Vital signs reviewed and are as follows: BP 122/75  Pulse 66  Temp(Src) 97.6 F (36.4 C)  Resp 18  SpO2 99%  EKG with ischemic findings -- patient with poor story. Will discuss EKG with cardiology.   9:02 AM Dr. Gwendolyn GrantWalden spoke with Dr. Myrtis SerKatz. Plan: 2D echo and cardiac enzymes. If neg, can be discharged.   12:01 PM 2D echo complete. Awaiting results.   1:45 PM Small area on which ECHO cannot rule-out hypokinesis. Otherwise unremarkable ECHO. D/w Dr. Gwendolyn GrantWalden. Patient is anxious to leave the ED now with or without discharge instructions. Need needs to pick up family members. Rx Afrin, sudafed. Cardiology f/u given and patient encouraged to follow-up.   Patient was counseled to return with severe chest pain, especially if the pain is crushing or pressure-like and spreads to the arms, back, neck, or jaw, or if they have sweating, nausea, or shortness of breath with the pain. They were encouraged to call 911 with these  symptoms.   They were also told to return if their chest pain gets worse and does not go away with rest, they have an attack of chest pain lasting longer than usual despite rest and treatment with the medications their caregiver has prescribed, if they wake from sleep with chest pain or shortness of breath, if they feel dizzy or faint, if they have chest pain not typical of their usual pain, or if they have any other emergent concerns regarding their health.  The patient verbalized understanding and agreed.   MDM   Final diagnoses:  Acute sinusitis, recurrence not specified, unspecified location  Abnormal EKG   Acute sinusitis: symptomatic treatment  Abnormal EKG: ECHO and troponin are both unremarkable. Pt to f/u with cardiology. Do not suspect ACS. Do not suspect peri/myocarditis.     Chris CriglerJoshua Vegas Fritze, PA-C 02/20/14 1350

## 2014-02-20 NOTE — ED Provider Notes (Signed)
Date: 02/20/2014  Rate: 61  Rhythm: normal sinus rhythm  QRS Axis: normal  Intervals: normal  ST/T Wave abnormalities: ST elevations laterally  Conduction Disutrbances:none  Narrative Interpretation:   Old EKG Reviewed: none available  42M here with epigastric pain, sharp and URI symptoms. His kids have been sick with similar symptoms. AFVSS here. EKG concerning for lateral STEMI. 28yo, no prior medical history, does not have active chest pain. I spoke personally with Dr. Myrtis SerKatz with Cards who recommended Echo and enzymes, if normal, if safe for discharge.    Elwin MochaBlair Julian Medina, MD 02/22/14 [phone]

## 2014-02-20 NOTE — ED Notes (Signed)
EKG given to Dr. Gwendolyn GrantWalden by Mayme GentaHayley, RN .

## 2014-02-20 NOTE — Progress Notes (Signed)
  Echocardiogram 2D Echocardiogram has been performed.  Chris Hale [name] 02/20/2014, 11:20 AM

## 2014-02-20 NOTE — ED Notes (Signed)
Pt taken to Echo via WC

## 2014-02-20 NOTE — ED Notes (Signed)
Per pt sts abdominal pain x 3 days with vomiting 2 days ago. sts that also he has been congested.

## 2014-02-20 NOTE — ED Notes (Signed)
EDP at bedside  

## 2014-02-20 NOTE — Discharge Instructions (Signed)
Please read and follow all provided instructions.  Your diagnoses today include:  1. Acute sinusitis, recurrence not specified, unspecified location   2. Abnormal EKG     Tests performed today include:  An EKG of your heart - abnormal as we discussed  Cardiac enzymes - a blood test for heart muscle damage, no sign of heart attack  Blood counts and electrolytes  Echocardiogram of heart - no definite problems  Vital signs. See below for your results today.   Medications prescribed:   Oxymetazoline - nasal spray for congestion. Do not use for more than 3 days because this medicine can cause rebound congestion.   Take any prescribed medications only as directed.  Follow-up instructions: Please follow-up with your primary care provider as soon as you can for further evaluation of your symptoms.   Return instructions:  SEEK IMMEDIATE MEDICAL ATTENTION IF:  You have severe chest pain, especially if the pain is crushing or pressure-like and spreads to the arms, back, neck, or jaw, or if you have sweating, nausea (feeling sick to your stomach), or shortness of breath. THIS IS AN EMERGENCY. Don't wait to see if the pain will go away. Get medical help at once. Call 911 or 0 (operator). DO NOT drive yourself to the hospital.   Your chest pain gets worse and does not go away with rest.   You have an attack of chest pain lasting longer than usual, despite rest and treatment with the medications your caregiver has prescribed.   You wake from sleep with chest pain or shortness of breath.  You feel dizzy or faint.  You have chest pain not typical of your usual pain for which you originally saw your caregiver.   You have any other emergent concerns regarding your health.  Additional Information: Chest pain comes from many different causes. Your caregiver has diagnosed you as having chest pain that is not specific for one problem, but does not require admission.  You are at low risk for an  acute heart condition or other serious illness.   Your vital signs today were: BP 123/73   Pulse 66   Temp(Src) 97.6 F (36.4 C)   Resp 12   SpO2 99% If your blood pressure (BP) was elevated above 135/85 this visit, please have this repeated by your doctor within one month. --------------

## 2014-02-21 NOTE — ED Provider Notes (Signed)
Medical screening examination/treatment/procedure(s) were conducted as a shared visit with non-physician practitioner(s) and myself.  I personally evaluated the patient during the encounter.   EKG Interpretation None      Patient here with abdominal pain, URI symptoms. EKG showed possible lateral STEMI. I spoke with Dr. Myrtis SerKatz about patient, he recommended Echo, labs. Patient without signs of symptoms of ACS. Labs ok. ECHO with good EF, possible area of hypokinesis on inferior wall. Patient is anxious to leave and will not stay. Given Cards f/u. Doubt STEMI, normal labs, H&P not c/w STEMI.  Elwin MochaBlair Vashaun Osmon, MD 02/21/14 [phone]
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
## 2014-10-04 DIAGNOSIS — Y9389 Activity, other specified: Secondary | ICD-10-CM | POA: Diagnosis not present

## 2014-10-04 DIAGNOSIS — S39012A Strain of muscle, fascia and tendon of lower back, initial encounter: Secondary | ICD-10-CM | POA: Diagnosis not present

## 2014-10-04 DIAGNOSIS — Y92410 Unspecified street and highway as the place of occurrence of the external cause: Secondary | ICD-10-CM | POA: Diagnosis not present

## 2014-10-04 DIAGNOSIS — S3992XA Unspecified injury of lower back, initial encounter: Secondary | ICD-10-CM | POA: Diagnosis present

## 2014-10-04 DIAGNOSIS — Z79899 Other long term (current) drug therapy: Secondary | ICD-10-CM | POA: Diagnosis not present

## 2014-10-04 DIAGNOSIS — Y998 Other external cause status: Secondary | ICD-10-CM | POA: Insufficient documentation

## 2014-10-05 ENCOUNTER — Encounter (HOSPITAL_COMMUNITY): Payer: Self-pay | Admitting: Emergency Medicine

## 2014-10-05 ENCOUNTER — Emergency Department (HOSPITAL_COMMUNITY)
Admission: EM | Admit: 2014-10-05 | Discharge: 2014-10-05 | Disposition: A | Discharge status: Home / Self Care | Payer: No Typology Code available for payment source | Attending: Emergency Medicine | Admitting: Emergency Medicine

## 2014-10-05 DIAGNOSIS — S39012A Strain of muscle, fascia and tendon of lower back, initial encounter: Secondary | ICD-10-CM

## 2014-10-05 MED ORDER — TRAMADOL HCL 50 MG PO TABS: 50.0000 mg | ORAL_TABLET | Freq: Four times a day (QID) | ORAL | Status: DC | PRN

## 2014-10-05 MED ORDER — NAPROXEN 375 MG PO TABS: 375.0000 mg | ORAL_TABLET | Freq: Two times a day (BID) | ORAL | Status: DC

## 2014-10-05 MED ORDER — CYCLOBENZAPRINE HCL 5 MG PO TABS: 5.0000 mg | ORAL_TABLET | Freq: Three times a day (TID) | ORAL | Status: DC | PRN
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
## 2015-10-29 ENCOUNTER — Emergency Department (HOSPITAL_COMMUNITY)
Admission: EM | Admit: 2015-10-29 | Discharge: 2015-10-29 | Disposition: A | Discharge status: Home / Self Care | Payer: No Typology Code available for payment source | Attending: Emergency Medicine | Admitting: Emergency Medicine

## 2015-10-29 ENCOUNTER — Encounter (HOSPITAL_COMMUNITY): Payer: Self-pay

## 2015-10-29 DIAGNOSIS — Z79899 Other long term (current) drug therapy: Secondary | ICD-10-CM | POA: Insufficient documentation

## 2015-10-29 DIAGNOSIS — S61217A Laceration without foreign body of left little finger without damage to nail, initial encounter: Secondary | ICD-10-CM | POA: Insufficient documentation

## 2015-10-29 DIAGNOSIS — Y999 Unspecified external cause status: Secondary | ICD-10-CM | POA: Insufficient documentation

## 2015-10-29 DIAGNOSIS — W268XXA Contact with other sharp object(s), not elsewhere classified, initial encounter: Secondary | ICD-10-CM | POA: Insufficient documentation

## 2015-10-29 DIAGNOSIS — Y9389 Activity, other specified: Secondary | ICD-10-CM | POA: Insufficient documentation

## 2015-10-29 DIAGNOSIS — Y929 Unspecified place or not applicable: Secondary | ICD-10-CM | POA: Insufficient documentation

## 2015-10-29 DIAGNOSIS — S61219A Laceration without foreign body of unspecified finger without damage to nail, initial encounter: Secondary | ICD-10-CM

## 2015-10-29 MED ORDER — IBUPROFEN 400 MG PO TABS
600.0000 mg | ORAL_TABLET | Freq: Once | ORAL | Status: AC
  Administered 2015-10-29: 600 mg via ORAL
  Filled 2015-10-29: qty 1

## 2015-10-29 MED ORDER — NAPROXEN 375 MG PO TABS: 375.0000 mg | ORAL_TABLET | Freq: Two times a day (BID) | ORAL | Status: DC

## 2015-10-29 MED ORDER — TETANUS-DIPHTH-ACELL PERTUSSIS 5-2.5-18.5 LF-MCG/0.5 IM SUSP
0.5000 mL | Freq: Once | INTRAMUSCULAR | Status: AC
  Administered 2015-10-29: 0.5 mL via INTRAMUSCULAR
  Filled 2015-10-29: qty 0.5

## 2015-10-29 NOTE — ED Notes (Signed)
PA at bedside.

## 2015-10-29 NOTE — Discharge Instructions (Signed)
Laceration Care, Adult  A laceration is a cut that goes through all layers of the skin. The cut also goes into the tissue that is right under the skin. Some cuts heal on their own. Others need to be closed with stitches (sutures), staples, skin adhesive strips, or wound glue. Taking care of your cut lowers your risk of infection and helps your cut to heal better.  HOW TO TAKE CARE OF YOUR CUT  For stitches or staples:  · Keep the wound clean and dry.  · If you were given a bandage (dressing), you should change it at least one time per day or as told by your doctor. You should also change it if it gets wet or dirty.  · Keep the wound completely dry for the first 24 hours or as told by your doctor. After that time, you may take a shower or a bath. However, make sure that the wound is not soaked in water until after the stitches or staples have been removed.  · Clean the wound one time each day or as told by your doctor:    Wash the wound with soap and water.    Rinse the wound with water until all of the soap comes off.    Pat the wound dry with a clean towel. Do not rub the wound.  · After you clean the wound, put a thin layer of antibiotic ointment on it as told by your doctor. This ointment:    Helps to prevent infection.    Keeps the bandage from sticking to the wound.  · Have your stitches or staples removed as told by your doctor.  If your doctor used skin adhesive strips:   · Keep the wound clean and dry.  · If you were given a bandage, you should change it at least one time per day or as told by your doctor. You should also change it if it gets dirty or wet.  · Do not get the skin adhesive strips wet. You can take a shower or a bath, but be careful to keep the wound dry.  · If the wound gets wet, pat it dry with a clean towel. Do not rub the wound.  · Skin adhesive strips fall off on their own. You can trim the strips as the wound heals. Do not remove any strips that are still stuck to the wound. They will  fall off after a while.  If your doctor used wound glue:  · Try to keep your wound dry, but you may briefly wet it in the shower or bath. Do not soak the wound in water, such as by swimming.  · After you take a shower or a bath, gently pat the wound dry with a clean towel. Do not rub the wound.  · Do not do any activities that will make you really sweaty until the skin glue has fallen off on its own.  · Do not apply liquid, cream, or ointment medicine to your wound while the skin glue is still on.  · If you were given a bandage, you should change it at least one time per day or as told by your doctor. You should also change it if it gets dirty or wet.  · If a bandage is placed over the wound, do not let the tape for the bandage touch the skin glue.  · Do not pick at the glue. The skin glue usually stays on for 5-10 days. Then, it   or when wound glue stays in place and the wound is healed. Make sure to wear a sunscreen of at least 30 SPF.  Take over-the-counter and prescription medicines only as told by your doctor.  If you were given antibiotic medicine or ointment, take or apply it as told by your doctor. Do not stop using the antibiotic even if your wound is getting better.  Do not scratch or pick at the wound.  Keep all follow-up visits as told by your doctor. This is important.  Check your wound every day for signs of infection. Watch for:  Redness, swelling, or pain.  Fluid, blood, or pus.  Raise (elevate) the injured area above the level of your heart while you are sitting or lying down, if possible. GET HELP IF:  You got a tetanus shot and you have any of these problems at the injection site:  Swelling.  Very bad pain.  Redness.  Bleeding.  You have a fever.  A wound that was  closed breaks open.  You notice a bad smell coming from your wound or your bandage.  You notice something coming out of the wound, such as wood or glass.  Medicine does not help your pain.  You have more redness, swelling, or pain at the site of your wound.  You have fluid, blood, or pus coming from your wound.  You notice a change in the color of your skin near your wound.  You need to change the bandage often because fluid, blood, or pus is coming from the wound.  You start to have a new rash.  You start to have numbness around the wound. GET HELP RIGHT AWAY IF:  You have very bad swelling around the wound.  Your pain suddenly gets worse and is very bad.  You notice painful lumps near the wound or on skin that is anywhere on your body.  You have a red streak going away from your wound.  The wound is on your hand or foot and you cannot move a finger or toe like you usually can.  The wound is on your hand or foot and you notice that your fingers or toes look pale or bluish.   This information is not intended to replace advice given to you by your health care provider. Make sure you discuss any questions you have with your health care provider.  Avoid bending your finger. Keep wound clean and dry. The laceration should be healed within 1 week. The Dermabond will come off on its own. At that time he can stop wearing the bandage. [facility]up with [facility] for reevaluation if her symptoms do not improve. Return to the ED if you experience severe worsening of your symptoms, redness or swelling around your wound, fevers, chills.

## 2015-10-29 NOTE — ED Notes (Signed)
Per pt he was fixing a light and light fixture came down on hand causing a lac to left pinky; Pt states he was wearing gloves; Pt a&o x 4 on arrival

## 2015-10-29 NOTE — ED Provider Notes (Signed)
CSN: [id number]     Arrival date & time 10/29/15  [id number] History  By signing my name below, I, Soijett Blue, attest that this documentation has been prepared under the direction and in the presence of Gaylyn RongSamantha Lennell Shanks, PA-C Electronically Signed: Soijett Blue, ED Scribe. 10/29/2015. 8:09 PM.   Chief Complaint  Patient presents with  . Laceration      The history is provided by the patient. No language interpreter was used.    Chris PalDarren Deiss is a 30 y.o. male who presents to the Emergency Department complaining of left pinky laceration onset 11 AM this morning. Pt reports that he was fixing a light with another person when the light fixture came down onto his left pinky, causing a laceration to the area. Pt denies injuries anywhere else and he reports that he was able to complete work. Pt is unsure of the status of his tetanus immunization. He states that he has not tried any medications for the relief for his symptoms. He denies color change, joint swelling, and any other symptoms.   No past medical history on file. No past surgical history on file. No family history on file. Social History  Substance Use Topics  . Smoking status: Never Smoker   . Smokeless tobacco: Not on file  . Alcohol Use: No    Review of Systems  Musculoskeletal: Positive for arthralgias (left 5th finger). Negative for joint swelling.  Skin: Positive for wound (lacertion to left pinky). Negative for color change.  All other systems reviewed and are negative.     Allergies  Review of patient's allergies indicates no known allergies.  Home Medications   Prior to Admission medications   Medication Sig Start Date End Date Taking? Authorizing Provider  Ascorbic Acid (VITAMIN C PO) Take 1 tablet by mouth every 4 (four) hours as needed (immune health).     Historical Provider, MD  Chlorphen-Pseudoephed-APAP (THERAFLU FLU/COLD PO) Take 1 packet by mouth 2 (two) times daily as needed (pain/cold symptoms).     Historical Provider, MD  cyclobenzaprine (FLEXERIL) 5 MG tablet Take 1 tablet (5 mg total) by mouth 3 (three) times daily as needed for muscle spasms. 10/05/14   Arthor CaptainAbigail Harris, PA-C  naproxen (NAPROSYN) 375 MG tablet Take 1 tablet (375 mg total) by mouth 2 (two) times daily. 10/05/14   Arthor CaptainAbigail Harris, PA-C  oxymetazoline (AFRIN NASAL SPRAY) 0.05 % nasal spray Place 1 spray into both nostrils 2 (two) times daily. 02/20/14   Renne CriglerJoshua Geiple, PA-C  Phenyleph-CPM-DM-APAP (TYLENOL COLD HEAD CONGESTION PO) Take 2 tablets by mouth every 4 (four) hours as needed (pain/cold symptoms).    Historical Provider, MD  pseudoephedrine (SUDAFED) 30 MG tablet Take 1 tablet (30 mg total) by mouth every 6 (six) hours as needed for congestion. 02/20/14   Renne CriglerJoshua Geiple, PA-C  traMADol (ULTRAM) 50 MG tablet Take 1 tablet (50 mg total) by mouth every 6 (six) hours as needed. 10/05/14   Abigail Harris, PA-C   BP 137/82 mmHg  Pulse 74  Temp(Src) 97.6 F (36.4 C) (Oral)  Resp 20  SpO2 100% Physical Exam  Constitutional: He is oriented to person, place, and time. He appears well-developed and well-nourished. No distress.  HENT:  Head: Normocephalic and atraumatic.  Eyes: Conjunctivae are normal. Right eye exhibits no discharge. Left eye exhibits no discharge. No scleral icterus.  Cardiovascular: Normal rate.   Pulmonary/Chest: Effort normal.  Musculoskeletal:  0.5 cm horizontal laceration to left fifth digit across PIP joint. No evidence of tendon injury.  No FB seen or palpated. No active bleeding.  Neurological: He is alert and oriented to person, place, and time. Coordination normal.  Skin: Skin is warm and dry. No rash noted. He is not diaphoretic. No erythema. No pallor.  Psychiatric: He has a normal mood and affect. His behavior is normal.  Nursing note and vitals reviewed.   ED Course  Procedures (including critical care time)  LACERATION REPAIR Performed by: Dub Mikes Authorized by: Dub Mikes Consent: Verbal consent obtained. Risks and benefits: risks, benefits and alternatives were discussed Consent given by: patient Patient identity confirmed: provided demographic data Prepped and Draped in normal sterile fashion Wound explored  Laceration Location: left 5th digit  Laceration Length: 0.5cm  No Foreign Bodies seen or palpated  Anesthesia: local infiltration  Local anesthetic: none  Irrigation method: syringe Amount of cleaning: standard  Skin closure: approximated  Technique: dermabond  Patient tolerance: Patient tolerated the procedure well with no immediate complications.  DIAGNOSTIC STUDIES: Oxygen Saturation is 100% on RA, nl by my interpretation.    COORDINATION OF CARE: 8:06 PM Discussed treatment plan with pt at bedside which includes wound care, laceration repair with dermabond, and update tetanus and pt agreed to plan.    Labs Review Labs Reviewed - No data to display  Imaging Review No results found.    EKG Interpretation None      MDM   Final diagnoses:  Finger laceration, initial encounter    Tetanus updated in ED. Laceration occurred < 12 hours prior to repair. Discussed laceration care with pt and answered questions. Wound repaired with dermabond. Pt advised to follow up if there are signs of dehiscence or infection. Pt is hemodynamically stable with no complaints prior to dc.    I personally performed the services described in this documentation, which was scribed in my presence. The recorded information has been reviewed and is accurate.     Lester Kinsman Bartley, PA-C 10/29/15 2312  Benjiman Core, MD 10/30/15 [phone]

## 2015-12-10 ENCOUNTER — Encounter (HOSPITAL_COMMUNITY): Payer: Self-pay

## 2015-12-10 ENCOUNTER — Emergency Department (HOSPITAL_COMMUNITY)
Admission: EM | Admit: 2015-12-10 | Discharge: 2015-12-10 | Disposition: A | Discharge status: Home / Self Care | Payer: No Typology Code available for payment source | Attending: Emergency Medicine | Admitting: Emergency Medicine

## 2015-12-10 DIAGNOSIS — X58XXXA Exposure to other specified factors, initial encounter: Secondary | ICD-10-CM | POA: Insufficient documentation

## 2015-12-10 DIAGNOSIS — Y929 Unspecified place or not applicable: Secondary | ICD-10-CM | POA: Insufficient documentation

## 2015-12-10 DIAGNOSIS — Y999 Unspecified external cause status: Secondary | ICD-10-CM | POA: Insufficient documentation

## 2015-12-10 DIAGNOSIS — Y939 Activity, unspecified: Secondary | ICD-10-CM | POA: Insufficient documentation

## 2015-12-10 DIAGNOSIS — S76012A Strain of muscle, fascia and tendon of left hip, initial encounter: Secondary | ICD-10-CM | POA: Insufficient documentation

## 2015-12-10 NOTE — ED Triage Notes (Signed)
Patient here with left hip pain since twisting hip on Friday while stepping off ladder pain with any movement, ambulation. No distress,

## 2015-12-10 NOTE — Discharge Instructions (Signed)
Please use ibuprofen or Tylenol as needed for discomfort. Please follow-up with orthopedic specialist if he continued to experience pain after 2 weeks.

## 2015-12-10 NOTE — ED Provider Notes (Signed)
[facility]-EMERGENCY DEPT Provider Note   CSN: [id number] date & time: 12/10/15  8413  First Provider Contact:  None    By signing my name below, I, Soijett Blue, attest that this documentation has been prepared under the direction and in the presence of Burna Forts, PA-C Electronically Signed: Soijett Blue, ED Scribe. 12/10/15. 9:38 AM.   History   Chief Complaint Chief Complaint  Patient presents with  . Hip Pain   HPI:  Chris Hale is a 30 y.o. male who presents to the Emergency Department complaining of left hip pain onset 5-6 days. Pt reports that he twisted his left hip while stepping incorrectly off a ladder and initially had pain to his left groin area. Pt states that his left hip pain is a 3/10 at rest and 7-8/10 with movement. Pt reports that his left hip pain is worsened with movement and lifting his left leg. Denies alleviating factors. Pt is having associated symptoms of left leg thigh, lower back pin, and gait problem due to left hip pain. Pt contributes his lower back pain, to compensating for his left hip pain. He notes that he has tried OTC pain medications and 3 aleve with his last dose of aleve being this morning with no relief of his symptoms. He denies color change, wound, rash, numbness, tingling, LE swelling, CP, SOB, n/v/d/c, abdominal pain, difficulty urinating, dysuria, and any other symptoms. Denies allergies to medications, past surgeries, or daily medication use. 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  The history is provided by the patient. No language interpreter was used.    History reviewed. No pertinent past medical history.  There are no active problems to display for this patient.   History reviewed. No pertinent surgical history.    Home Medications    Prior to Admission medications   Medication Sig Start Date End Date Taking? Authorizing Provider  Ascorbic Acid (VITAMIN C PO) Take 1 tablet by mouth every 4 (four) hours as needed (immune  health).     Historical Provider, MD  Chlorphen-Pseudoephed-APAP (THERAFLU FLU/COLD PO) Take 1 packet by mouth 2 (two) times daily as needed (pain/cold symptoms).    Historical Provider, MD  cyclobenzaprine (FLEXERIL) 5 MG tablet Take 1 tablet (5 mg total) by mouth 3 (three) times daily as needed for muscle spasms. 10/05/14   Arthor Captain, PA-C  naproxen (NAPROSYN) 375 MG tablet Take 1 tablet (375 mg total) by mouth 2 (two) times daily. 10/29/15   Samantha Tripp Dowless, PA-C  oxymetazoline (AFRIN NASAL SPRAY) 0.05 % nasal spray Place 1 spray into both nostrils 2 (two) times daily. 02/20/14   Renne Crigler, PA-C  Phenyleph-CPM-DM-APAP (TYLENOL COLD HEAD CONGESTION PO) Take 2 tablets by mouth every 4 (four) hours as needed (pain/cold symptoms).    Historical Provider, MD  pseudoephedrine (SUDAFED) 30 MG tablet Take 1 tablet (30 mg total) by mouth every 6 (six) hours as needed for congestion. 02/20/14   Renne Crigler, PA-C  traMADol (ULTRAM) 50 MG tablet Take 1 tablet (50 mg total) by mouth every 6 (six) hours as needed. 10/05/14   Arthor Captain, PA-C    Family History No family history on file.  Social History Social History  Substance Use Topics  . Smoking status: Never Smoker  . Smokeless tobacco: Never Used  . Alcohol use No     Allergies   Review of patient's allergies indicates no known allergies.   Review of Systems Review of Systems  Cardiovascular: Negative for leg swelling.  Gastrointestinal:  Negative for constipation, diarrhea, nausea and vomiting.  Genitourinary: Negative for difficulty urinating, dysuria and hematuria.  Musculoskeletal: Positive for arthralgias (left hip) and gait problem (due to pain). Negative for joint swelling.  Skin: Negative for color change, rash and wound.  Neurological: Negative for numbness.       No tingling     Physical Exam Updated Vital Signs BP 130/82 (BP Location: Right Arm)   Pulse 63   Temp 98 F (36.7 C) (Oral)   Resp 20   Ht  6\' 1"  (1.854 m)   Wt 108.9 kg   SpO2 100%   BMI 31.66 kg/m   Physical Exam  Constitutional: He is oriented to person, place, and time. He appears well-developed and well-nourished. No distress.  HENT:  Head: Normocephalic and atraumatic.  Eyes: EOM are normal.  Neck: Neck supple.  Cardiovascular: Normal rate, regular rhythm and normal heart sounds.  Exam reveals no gallop and no friction rub.   No murmur heard. Pulmonary/Chest: Effort normal and breath sounds normal. No respiratory distress. He has no wheezes. He has no rales.  Abdominal: He exhibits no distension.  Musculoskeletal: Normal range of motion.       Left hip: He exhibits tenderness. He exhibits normal strength and no swelling.  TTP of left anterior hip flexor worse with active flexion of hip. No pain with passive ROM. No swelling or edema. Distal strength 5/5.  Neurological: He is alert and oriented to person, place, and time.  Skin: Skin is warm and dry.  Psychiatric: He has a normal mood and affect. His behavior is normal.  Nursing note and vitals reviewed.    ED Treatments / Results  DIAGNOSTIC STUDIES: Oxygen Saturation is 99% on RA, nl by my interpretation.    COORDINATION OF CARE: 9:18 AM Discussed treatment plan with pt at bedside which includes 800 mg ibuprofen TID, hip strength gaining exercise, work note, and pt agreed to plan.  Procedures Procedures (including critical care time)  Medications Ordered in ED Medications - No data to display   Initial Impression / Assessment and Plan / ED Course  I have reviewed the triage vital signs and the nursing notes.   Clinical Course     Final Clinical Impressions(s) / ED Diagnoses   Final diagnoses:  Strain of hip flexor, left, initial encounter   Labs:   Imaging:   Consults:   Therapeutics:   Discharge Meds:   Assessment/Plan: Patient presents with left hip pain s/p mechanical injury, likely left hip flexor strain. No infectious etiology.  Discharged home with symptomatic care instructions. Patient will be discharged home & is agreeable with above plan. Returns precautions discussed. Pt appears safe for discharge.   New Prescriptions New Prescriptions   No medications on file   I personally performed the services described in this documentation, which was scribed in my presence. The recorded information has been reviewed and is accurate.     Eyvonne Mechanic, PA-C 12/10/15 1020    Charlynne Pander, MD 12/10/15 [phone]

## 2016-09-14 ENCOUNTER — Encounter (HOSPITAL_COMMUNITY): Payer: Self-pay | Admitting: Emergency Medicine

## 2016-09-14 ENCOUNTER — Emergency Department (HOSPITAL_COMMUNITY)
Admission: EM | Admit: 2016-09-14 | Discharge: 2016-09-15 | Disposition: A | Discharge status: Home / Self Care | Payer: No Typology Code available for payment source | Attending: Emergency Medicine | Admitting: Emergency Medicine

## 2016-09-14 ENCOUNTER — Emergency Department (HOSPITAL_COMMUNITY): Payer: No Typology Code available for payment source

## 2016-09-14 DIAGNOSIS — R079 Chest pain, unspecified: Secondary | ICD-10-CM | POA: Insufficient documentation

## 2016-09-14 LAB — CBC
HEMATOCRIT: 44.6 % (ref 39.0–52.0)
HEMOGLOBIN: 14.7 g/dL (ref 13.0–17.0)
MCH: 28.2 pg (ref 26.0–34.0)
MCHC: 33 g/dL (ref 30.0–36.0)
MCV: 85.6 fL (ref 78.0–100.0)
Platelets: 166 10*3/uL (ref 150–400)
RBC: 5.21 MIL/uL (ref 4.22–5.81)
RDW: 13.9 % (ref 11.5–15.5)
WBC: 9.2 10*3/uL (ref 4.0–10.5)

## 2016-09-14 LAB — I-STAT TROPONIN, ED: TROPONIN I, POC: 0 ng/mL (ref 0.00–0.08)

## 2016-09-14 MED ORDER — GI COCKTAIL ~~LOC~~
30.0000 mL | Freq: Once | ORAL | Status: AC
  Administered 2016-09-14: 30 mL via ORAL
  Filled 2016-09-14: qty 30

## 2016-09-14 NOTE — ED Provider Notes (Signed)
[facility]-EMERGENCY DEPT Provider Note   CSN: [id number] Arrival date & time: 09/14/16  2305  By signing my name below, I, Diona Browner, attest that this documentation has been prepared under the direction and in the presence of Gilda Crease, MD. Electronically Signed: Diona Browner, ED Scribe. 09/14/16. 11:31 PM.  History   Chief Complaint Chief Complaint  Patient presents with  . Chest Pain    HPI Chris Hale is a 31 y.o. male who presents to the Emergency Department complaining of mild, throbbing CP that started ~ 1 hour ago while he was in bed. Associated sx include SOB, tightness, nausea and vomiting. Pt notes emesis neither improved nor worsened his sx. He reports having similar sx a couple of years ago. Does not smoke. No hx of DM. Mother had open heart surgery in 2014. Pt denies fever, HA or any other sx at this time.  The history is provided by the patient. No language interpreter was used.    History reviewed. No pertinent past medical history.  There are no active problems to display for this patient.   History reviewed. No pertinent surgical history.     Home Medications    Prior to Admission medications   Medication Sig Start Date End Date Taking? Authorizing Provider  Ascorbic Acid (VITAMIN C PO) Take 1 tablet by mouth every 4 (four) hours as needed (immune health).     Historical Provider, MD  Chlorphen-Pseudoephed-APAP (THERAFLU FLU/COLD PO) Take 1 packet by mouth 2 (two) times daily as needed (pain/cold symptoms).    Historical Provider, MD  cyclobenzaprine (FLEXERIL) 5 MG tablet Take 1 tablet (5 mg total) by mouth 3 (three) times daily as needed for muscle spasms. 10/05/14   Arthor Captain, PA-C  famotidine (PEPCID) 20 MG tablet Take 1 tablet (20 mg total) by mouth 2 (two) times daily. 09/15/16   Gilda Crease, MD  naproxen (NAPROSYN) 375 MG tablet Take 1 tablet (375 mg total) by mouth 2 (two) times daily. 10/29/15   Samantha Tripp Dowless, PA-C   ondansetron (ZOFRAN) 4 MG tablet Take 1 tablet (4 mg total) by mouth every 6 (six) hours. 09/15/16   Gilda Crease, MD  oxymetazoline (AFRIN NASAL SPRAY) 0.05 % nasal spray Place 1 spray into both nostrils 2 (two) times daily. 02/20/14   Renne Crigler, PA-C  Phenyleph-CPM-DM-APAP (TYLENOL COLD HEAD CONGESTION PO) Take 2 tablets by mouth every 4 (four) hours as needed (pain/cold symptoms).    Historical Provider, MD  pseudoephedrine (SUDAFED) 30 MG tablet Take 1 tablet (30 mg total) by mouth every 6 (six) hours as needed for congestion. 02/20/14   Renne Crigler, PA-C  traMADol (ULTRAM) 50 MG tablet Take 1 tablet (50 mg total) by mouth every 6 (six) hours as needed. 10/05/14   Arthor Captain, PA-C    Family History No family history on file.  Social History Social History  Substance Use Topics  . Smoking status: Never Smoker  . Smokeless tobacco: Never Used  . Alcohol use Yes     Allergies   Patient has no known allergies.   Review of Systems Review of Systems  Constitutional: Negative for fever.  Respiratory: Positive for shortness of breath.   Cardiovascular: Positive for chest pain.  Gastrointestinal: Positive for nausea and vomiting.  Neurological: Negative for headaches.  All other systems reviewed and are negative.    Physical Exam Updated Vital Signs BP 120/84   Pulse (!) 40   Temp 98.7 F (37.1 C) (Oral)   Resp  18   Ht 6' (1.829 m)   Wt 250 lb (113.4 kg)   SpO2 95%   BMI 33.91 kg/m   Physical Exam  Constitutional: He is oriented to person, place, and time. He appears well-developed and well-nourished. No distress.  HENT:  Head: Normocephalic and atraumatic.  Right Ear: Hearing normal.  Left Ear: Hearing normal.  Nose: Nose normal.  Mouth/Throat: Oropharynx is clear and moist and mucous membranes are normal.  Eyes: Conjunctivae and EOM are normal. Pupils are equal, round, and reactive to light.  Neck: Normal range of motion. Neck supple.    Cardiovascular: Regular rhythm, S1 normal and S2 normal.  Exam reveals no gallop and no friction rub.   No murmur heard. Pulmonary/Chest: Effort normal and breath sounds normal. No respiratory distress. He exhibits no tenderness.  Abdominal: Soft. Normal appearance and bowel sounds are normal. There is no hepatosplenomegaly. There is no tenderness. There is no rebound, no guarding, no tenderness at McBurney's point and negative Murphy's sign. No hernia.  Musculoskeletal: Normal range of motion.  Neurological: He is alert and oriented to person, place, and time. He has normal strength. No cranial nerve deficit or sensory deficit. Coordination normal. GCS eye subscore is 4. GCS verbal subscore is 5. GCS motor subscore is 6.  Skin: Skin is warm, dry and intact. No rash noted. No cyanosis.  Psychiatric: He has a normal mood and affect. His speech is normal and behavior is normal. Thought content normal.  Nursing note and vitals reviewed.    ED Treatments / Results  DIAGNOSTIC STUDIES: Oxygen Saturation is 97% on RA, normal by my interpretation.   COORDINATION OF CARE: 11:27 PM-Discussed next steps with pt. Pt verbalized understanding and is agreeable with the plan.   Labs (all labs ordered are listed, but only abnormal results are displayed) Labs Reviewed  BASIC METABOLIC PANEL - Abnormal; Notable for the following:       Result Value   Glucose, Bld 121 (*)    All other components within normal limits  CBC  I-STAT TROPOININ, ED  I-STAT TROPOININ, ED    EKG  EKG Interpretation  Date/Time:  Tuesday Sep 14 2016 23:10:48 EDT Ventricular Rate:  72 PR Interval:  146 QRS Duration: 84 QT Interval:  398 QTC Calculation: 435 R Axis:   68 Text Interpretation:  Normal sinus rhythm ST elevation consider lateral injury or acute infarct Abnormal ECG No significant change since last tracing Confirmed by Mersedes Alber  MD, Qunicy Higinbotham [phone]) on 09/15/2016 12:13:57 AM       Radiology Dg Chest 2  View  Result Date: 09/14/2016 CLINICAL DATA:  Midchest pain tonight EXAM: CHEST  2 VIEW COMPARISON:  None. FINDINGS: The lungs are clear. The pulmonary vasculature is normal. Heart size is normal. Hilar and mediastinal contours are unremarkable. There is no pleural effusion. IMPRESSION: No active cardiopulmonary disease. Electronically Signed   By: Ellery Plunk M.D.   On: 09/14/2016 23:55    Procedures Procedures (including critical care time)  Medications Ordered in ED Medications  gi cocktail (Maalox,Lidocaine,Donnatal) (30 mLs Oral Given 09/14/16 2345)     Initial Impression / Assessment and Plan / ED Course  I have reviewed the triage vital signs and the nursing notes.  Pertinent labs & imaging results that were available during my care of the patient were reviewed by me and considered in my medical decision making (see chart for details).     Patient presents to the emergency department for evaluation of chest pain. Patient  has been experiencing burning pain in the center of his chest associated with nausea and vomiting. He does report he had similar symptoms a couple of years ago, he was determined that it was not cardiac. Patient does not have any significant cardiac risk factors other than history of heart disease in his mother. He was felt to be very low risk for cardiac etiology. Troponin negative. All labs normal. Chest x-ray normal. EKG did have a pattern of high lateral ST elevation, but this was seen previously on EKG. There is no change to his EKG. Therefore, No evidence of acute MI. Because his EKG was not normal, patient was held in the ER for repeat troponin. Repeat troponin was once again negative, patient felt safe for discharge, will treat for likely GI etiology of his chest pain.  Final Clinical Impressions(s) / ED Diagnoses   Final diagnoses:  Chest pain, unspecified type    New Prescriptions New Prescriptions   FAMOTIDINE (PEPCID) 20 MG TABLET    Take 1 tablet  (20 mg total) by mouth 2 (two) times daily.   ONDANSETRON (ZOFRAN) 4 MG TABLET    Take 1 tablet (4 mg total) by mouth every 6 (six) hours.   I personally performed the services described in this documentation, which was scribed in my presence. The recorded information has been reviewed and is accurate.     Gilda Crease, MD 09/15/16 [phone]

## 2016-09-14 NOTE — ED Triage Notes (Signed)
Patient reports central chest pain with emesis x1 this evening , denies SOB or diaphoresis .

## 2016-09-15 LAB — BASIC METABOLIC PANEL
ANION GAP: 6 (ref 5–15)
BUN: 19 mg/dL (ref 6–20)
CHLORIDE: 105 mmol/L (ref 101–111)
CO2: 24 mmol/L (ref 22–32)
Calcium: 9.1 mg/dL (ref 8.9–10.3)
Creatinine, Ser: 1.11 mg/dL (ref 0.61–1.24)
GFR calc Af Amer: >60 mL/min (ref >60)
GFR calc non Af Amer: >60 mL/min (ref >60)
GLUCOSE: 121 mg/dL — AB (ref 65–99)
Potassium: 3.9 mmol/L (ref 3.5–5.1)
Sodium: 135 mmol/L (ref 135–145)

## 2016-09-15 LAB — I-STAT TROPONIN, ED: Troponin i, poc: 0.01 ng/mL (ref 0.00–0.08)

## 2016-09-15 MED ORDER — FAMOTIDINE 20 MG PO TABS: 20.0000 mg | ORAL_TABLET | Freq: Two times a day (BID) | ORAL | 0 refills | Status: DC

## 2016-09-15 MED ORDER — ONDANSETRON HCL 4 MG PO TABS: 4.0000 mg | ORAL_TABLET | Freq: Four times a day (QID) | ORAL | 0 refills | Status: DC

## 2016-09-29 ENCOUNTER — Emergency Department (HOSPITAL_COMMUNITY): Payer: Self-pay

## 2016-09-29 ENCOUNTER — Encounter (HOSPITAL_COMMUNITY): Payer: Self-pay | Admitting: *Deleted

## 2016-09-29 ENCOUNTER — Emergency Department (HOSPITAL_COMMUNITY)
Admission: EM | Admit: 2016-09-29 | Discharge: 2016-09-29 | Disposition: A | Discharge status: Home / Self Care | Payer: Self-pay | Attending: Emergency Medicine | Admitting: Emergency Medicine

## 2016-09-29 DIAGNOSIS — I493 Ventricular premature depolarization: Secondary | ICD-10-CM | POA: Insufficient documentation

## 2016-09-29 DIAGNOSIS — R42 Dizziness and giddiness: Secondary | ICD-10-CM | POA: Insufficient documentation

## 2016-09-29 LAB — HEPATIC FUNCTION PANEL
ALK PHOS: 59 U/L (ref 38–126)
ALT: 24 U/L (ref 17–63)
AST: 19 U/L (ref 15–41)
Albumin: 3.7 g/dL (ref 3.5–5.0)
BILIRUBIN TOTAL: 0.6 mg/dL (ref 0.3–1.2)
Total Protein: 6.7 g/dL (ref 6.5–8.1)

## 2016-09-29 LAB — BASIC METABOLIC PANEL
ANION GAP: 7 (ref 5–15)
BUN: 20 mg/dL (ref 6–20)
CHLORIDE: 107 mmol/L (ref 101–111)
CO2: 22 mmol/L (ref 22–32)
Calcium: 8.7 mg/dL — ABNORMAL LOW (ref 8.9–10.3)
Creatinine, Ser: 1.03 mg/dL (ref 0.61–1.24)
GFR calc Af Amer: >60 mL/min (ref >60)
GLUCOSE: 119 mg/dL — AB (ref 65–99)
POTASSIUM: 4 mmol/L (ref 3.5–5.1)
Sodium: 136 mmol/L (ref 135–145)

## 2016-09-29 LAB — CBC
HCT: 44.6 % (ref 39.0–52.0)
HEMOGLOBIN: 14.8 g/dL (ref 13.0–17.0)
MCH: 28.4 pg (ref 26.0–34.0)
MCHC: 33.2 g/dL (ref 30.0–36.0)
MCV: 85.4 fL (ref 78.0–100.0)
Platelets: 149 10*3/uL — ABNORMAL LOW (ref 150–400)
RBC: 5.22 MIL/uL (ref 4.22–5.81)
RDW: 13.7 % (ref 11.5–15.5)
WBC: 5.9 10*3/uL (ref 4.0–10.5)

## 2016-09-29 LAB — URINALYSIS, ROUTINE W REFLEX MICROSCOPIC
BILIRUBIN URINE: NEGATIVE
Glucose, UA: NEGATIVE mg/dL
HGB URINE DIPSTICK: NEGATIVE
Ketones, ur: NEGATIVE mg/dL
LEUKOCYTES UA: NEGATIVE
NITRITE: NEGATIVE
PROTEIN: NEGATIVE mg/dL
SPECIFIC GRAVITY, URINE: 1.012 (ref 1.005–1.030)
pH: 7 (ref 5.0–8.0)

## 2016-09-29 LAB — D-DIMER, QUANTITATIVE (NOT AT ARMC): D DIMER QUANT: 0.33 ug{FEU}/mL (ref 0.00–0.50)

## 2016-09-29 LAB — TSH: TSH: 1.037 u[IU]/mL (ref 0.350–4.500)

## 2016-09-29 LAB — TROPONIN I: Troponin I: <0.03 ng/mL (ref <0.03)

## 2016-09-29 MED ORDER — SODIUM CHLORIDE 0.9 % IV SOLN
INTRAVENOUS | Status: DC
  Administered 2016-09-29: 10:00:00 via INTRAVENOUS

## 2016-09-29 MED ORDER — SODIUM CHLORIDE 0.9 % IV BOLUS (SEPSIS)
500.0000 mL | Freq: Once | INTRAVENOUS | Status: AC
  Administered 2016-09-29: 500 mL via INTRAVENOUS

## 2016-09-29 NOTE — ED Triage Notes (Addendum)
Pt reports working yesterday and had dizziness, lightheaded and fatigue. Sent here by work for further eval. No acute distress is noted at triage.

## 2016-09-29 NOTE — Discharge Instructions (Signed)
Make appointment to follow-up with cardiology. Return for any new or worse symptoms. Work note provided. Today's workup only found premature ventricular contractions. No other abnormal findings. If symptoms persist and cleared by cardiology may require follow-up with neurology.

## 2016-09-29 NOTE — ED Notes (Signed)
EKG shown to dr Silverio Layyao

## 2016-09-29 NOTE — ED Notes (Signed)
Pt ambulated to RR .  

## 2016-09-29 NOTE — ED Notes (Signed)
Pt ambulatory to room, appeared steady on his feet. Pt states he was here two weeks ago for dizziness and chest pain, states he works outside and began feeling dizzy again yesterday, no associated cp, states symptoms resolved after he rested for a little while. Denies dizziness today. A/ox4
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
## 2018-03-21 ENCOUNTER — Other Ambulatory Visit: Payer: Self-pay | Admitting: Orthopedic Surgery

## 2018-03-21 DIAGNOSIS — M795 Residual foreign body in soft tissue: Secondary | ICD-10-CM

## 2018-03-21 DIAGNOSIS — M25562 Pain in left knee: Secondary | ICD-10-CM

## 2018-03-30 ENCOUNTER — Ambulatory Visit
Admission: RE | Admit: 2018-03-30 | Discharge: 2018-03-30 | Disposition: A | Discharge status: Home / Self Care | Payer: BLUE CROSS/BLUE SHIELD | Source: Ambulatory Visit | Attending: Orthopedic Surgery | Admitting: Orthopedic Surgery

## 2018-03-30 DIAGNOSIS — M25562 Pain in left knee: Secondary | ICD-10-CM

## 2018-03-30 DIAGNOSIS — M795 Residual foreign body in soft tissue: Secondary | ICD-10-CM
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
## 2020-02-01 ENCOUNTER — Other Ambulatory Visit: Payer: BLUE CROSS/BLUE SHIELD

## 2024-01-19 ENCOUNTER — Other Ambulatory Visit: Payer: Self-pay

## 2024-01-19 ENCOUNTER — Encounter (HOSPITAL_BASED_OUTPATIENT_CLINIC_OR_DEPARTMENT_OTHER): Payer: Self-pay | Admitting: *Deleted

## 2024-01-19 ENCOUNTER — Emergency Department (HOSPITAL_BASED_OUTPATIENT_CLINIC_OR_DEPARTMENT_OTHER)
Admission: EM | Admit: 2024-01-19 | Discharge: 2024-01-19 | Disposition: A | Discharge status: Home / Self Care | Attending: Emergency Medicine | Admitting: Emergency Medicine

## 2024-01-19 DIAGNOSIS — H9201 Otalgia, right ear: Secondary | ICD-10-CM | POA: Diagnosis present

## 2024-01-19 DIAGNOSIS — R0981 Nasal congestion: Secondary | ICD-10-CM | POA: Insufficient documentation

## 2024-01-19 MED ORDER — ACETAMINOPHEN 500 MG PO TABS
1000.0000 mg | ORAL_TABLET | Freq: Once | ORAL | Status: AC
  Administered 2024-01-19: 1000 mg via ORAL
  Filled 2024-01-19: qty 2

## 2024-01-19 MED ORDER — KETOROLAC TROMETHAMINE 15 MG/ML IJ SOLN
15.0000 mg | Freq: Once | INTRAMUSCULAR | Status: AC
  Administered 2024-01-19: 15 mg via INTRAMUSCULAR
  Filled 2024-01-19: qty 1

## 2024-01-19 MED ORDER — FLUTICASONE PROPIONATE 50 MCG/ACT NA SUSP: 2.0000 | Freq: Every day | NASAL | 0 refills | Status: AC

## 2024-01-19 MED ORDER — DEXAMETHASONE SODIUM PHOSPHATE 10 MG/ML IJ SOLN
10.0000 mg | Freq: Once | INTRAMUSCULAR | Status: AC
  Administered 2024-01-19: 10 mg via INTRAMUSCULAR
  Filled 2024-01-19: qty 1
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
## 2024-01-19 NOTE — ED Triage Notes (Signed)
 Pt to ED reporting he flew yesterday and has not been able to pop his ear since landing. Pain in right ear. No fever, cough or flu like symptoms.   Right lower molar also scheduled to be removed tomorrow.

## 2024-01-19 NOTE — ED Notes (Signed)
 Pt d/c instructions, medications, and follow-up care reviewed with pt. Pt verbalized understanding and had no further questions at time of d/c. Pt CA&Ox4, ambulatory, and in NAD at time of d/c

## 2024-01-19 NOTE — Discharge Instructions (Signed)
 Please read and follow all provided instructions.  Your diagnoses today include:  1. Right ear pain    Tests performed today include: Vital signs. See below for your results today.   Medications prescribed:  Flonase  - nasal steroid to help with inflammation  Take any prescribed medications only as directed.  Home care instructions:  Follow any educational materials contained in this packet.  BE VERY CAREFUL not to take multiple medicines containing Tylenol  (also called acetaminophen ). Doing so can lead to an overdose which can damage your liver and cause liver failure and possibly death.   Follow-up instructions: Please follow-up with your primary care provider as needed for further evaluation of your symptoms.   Return instructions:  Please return to the Emergency Department if you experience worsening symptoms.  Please return if you have any other emergent concerns.  Additional Information:  Your vital signs today were: BP (!) 144/96 (BP Location: Left Arm)   Pulse 65   Temp 97.8 F (36.6 C) (Temporal)   Resp 14   SpO2 100%  If your blood pressure (BP) was elevated above 135/85 this visit, please have this repeated by your doctor within one month. --------------
